# Patient Record
Sex: Female | Born: 1990 | Race: White | Hispanic: No | Marital: Single | Smoking: Never smoker
Health system: Southern US, Community
[De-identification: ages and names within clinical notes are randomized; demographics above are authoritative.]

## PROBLEM LIST (undated history)

## (undated) DIAGNOSIS — E119 Type 2 diabetes mellitus without complications: Secondary | ICD-10-CM

## (undated) DIAGNOSIS — E282 Polycystic ovarian syndrome: Secondary | ICD-10-CM

---
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
## 2020-09-01 ENCOUNTER — Other Ambulatory Visit: Payer: Self-pay

## 2020-09-01 ENCOUNTER — Emergency Department
Admission: EM | Admit: 2020-09-01 | Discharge: 2020-09-01 | Disposition: A | Discharge status: Home / Self Care | Payer: Self-pay | Source: Home / Self Care

## 2020-09-01 DIAGNOSIS — N3001 Acute cystitis with hematuria: Secondary | ICD-10-CM

## 2020-09-01 DIAGNOSIS — R300 Dysuria: Secondary | ICD-10-CM

## 2020-09-01 HISTORY — DX: Type 2 diabetes mellitus without complications: E11.9

## 2020-09-01 LAB — POCT URINALYSIS DIP (MANUAL ENTRY)
Bilirubin, UA: NEGATIVE
Glucose, UA: NEGATIVE mg/dL
Ketones, POC UA: NEGATIVE mg/dL
Leukocytes, UA: NEGATIVE
Nitrite, UA: NEGATIVE
Protein Ur, POC: NEGATIVE mg/dL
Spec Grav, UA: 1.02 (ref 1.010–1.025)
Urobilinogen, UA: 0.2 E.U./dL
pH, UA: 7 (ref 5.0–8.0)

## 2020-09-01 NOTE — ED Triage Notes (Signed)
Patient presents to Urgent Care with complaints of burning with urination and frequency since last week. Patient reports she did a teledoc visit and was prescribed macrobid, states the urgency got better but the burning never went away and now she is finished w/ the antibiotic and still has the burning and now some blood with her urine.

## 2020-09-01 NOTE — Discharge Instructions (Signed)
°  You will be notified in 2-4 days of the results of your urine culture if an antibiotic is indicated. If no bacteria grows or it shows that macrobid should have helped, you will need further evaluation and treatment of your symptoms by a urologist.  If a different antibiotic is indicated, you will be notified and the medication can be called into your preferred pharmacy. In the meantime, continue to stay well hydrated to help keep your urine clear to light yellow in color.

## 2020-09-01 NOTE — ED Provider Notes (Signed)
[facility]    CSN: [id number] Arrival date & time: 09/01/20  1138      History   Chief Complaint Chief Complaint  Patient presents with  . Dysuria    HPI Susan Jarvis is a 29 y.o. female.   HPI  Susan Jarvis is a 29 y.o. female presenting to UC with c/o continued burning with urination and hematuria despite completing a course of macrobid yesterday. Pt did a Teledoc visit on Sunday for urinary frequency and burning. The frequency has resolved but she continues to have burning.  Hx of PCOS, believes her last period was in October. Knows the blood is from her urine rather than vaginal bleeding.  Denies fever, chills, n/v/d. No abdominal pain or back pain. No hx of kidney stones or family hx of kidney stones.    Past Medical History:  Diagnosis Date  . Diabetes mellitus without complication (HCC)     There are no problems to display for this patient.   History reviewed. No pertinent surgical history.  OB History   No obstetric history on file.      Home Medications    Prior to Admission medications   Medication Sig Start Date End Date Taking? Authorizing Provider  amphetamine-dextroamphetamine (ADDERALL) 30 MG tablet Take 40 mg by mouth daily.    [provider]  metFORMIN (GLUMETZA) 500 MG (MOD) 24 hr tablet Take 500 mg by mouth daily with breakfast.    [provider]  traZODone (DESYREL) 100 MG tablet Take 200 mg by mouth at bedtime.    [provider]    Family History Family History  Problem Relation Age of Onset  . Healthy Mother   . Healthy Father     Social History Social History   Tobacco Use  . Smoking status: Never Smoker  . Smokeless tobacco: Never Used  Substance Use Topics  . Alcohol use: Not Currently     Allergies   Patient has no known allergies.   Review of Systems Review of Systems  Constitutional: Negative for chills and fever.  Gastrointestinal: Negative for abdominal pain,  diarrhea, nausea and vomiting.  Genitourinary: Positive for dysuria, frequency and hematuria. Negative for pelvic pain, vaginal bleeding, vaginal discharge and vaginal pain.  Musculoskeletal: Negative for back pain.  Neurological: Negative for dizziness and headaches.     Physical Exam Triage Vital Signs ED Triage Vitals  Enc Vitals Group     BP 09/01/20 1155 (!) 145/100     Pulse Rate 09/01/20 1155 (!) 101     Resp 09/01/20 1155 16     Temp 09/01/20 1155 98.9 F (37.2 C)     Temp Source 09/01/20 1155 Oral     SpO2 09/01/20 1155 98 %     Weight --      Height --      Head Circumference --      Peak Flow --      Pain Score 09/01/20 1152 0     Pain Loc --      Pain Edu? --      Excl. in GC? --    No data found.  Updated Vital Signs BP (S) (!) 145/100 (BP Location: Right Wrist)   Pulse (!) 101   Temp 98.9 F (37.2 C) (Oral)   Resp 16   SpO2 98%   Visual Acuity Right Eye Distance:   Left Eye Distance:   Bilateral Distance:    Right Eye Near:   Left Eye  Near:    Bilateral Near:     Physical Exam Vitals and nursing note reviewed.  Constitutional:      Appearance: Normal appearance. She is well-developed and well-nourished.  HENT:     Head: Normocephalic and atraumatic.  Eyes:     Extraocular Movements: EOM normal.  Cardiovascular:     Rate and Rhythm: Normal rate and regular rhythm.  Pulmonary:     Effort: Pulmonary effort is normal. No respiratory distress.     Breath sounds: Normal breath sounds.  Abdominal:     General: There is no distension.     Palpations: Abdomen is soft.     Tenderness: There is no abdominal tenderness. There is no right CVA tenderness or left CVA tenderness.  Musculoskeletal:        General: Normal range of motion.     Cervical back: Normal range of motion.  Skin:    General: Skin is warm and dry.  Neurological:     Mental Status: She is alert and oriented to person, place, and time.  Psychiatric:        Mood and Affect: Mood  and affect normal.        Behavior: Behavior normal.      UC Treatments / Results  Labs (all labs ordered are listed, but only abnormal results are displayed) Labs Reviewed  POCT URINALYSIS DIP (MANUAL ENTRY) - Abnormal; Notable for the following components:      Result Value   Clarity, UA cloudy (*)    Blood, UA large (*)    All other components within normal limits  URINE CULTURE    EKG   Radiology No results found.  Procedures Procedures (including critical care time)  Medications Ordered in UC Medications - No data to display  Initial Impression / Assessment and Plan / UC Course  I have reviewed the triage vital signs and the nursing notes.  Pertinent labs & imaging results that were available during my care of the patient were reviewed by me and considered in my medical decision making (see chart for details).     UA c/w hematuria  No evidence of UTI at this time, Due to recent antibiotic use, culture sent Encouraged good hydration, f/u with urology if symptoms persist, especially if urine culture is normal.  Final Clinical Impressions(s) / UC Diagnoses   Final diagnoses:  Dysuria  Hematuria due to acute cystitis     Discharge Instructions      You will be notified in 2-4 days of the results of your urine culture if an antibiotic is indicated. If no bacteria grows or it shows that macrobid should have helped, you will need further evaluation and treatment of your symptoms by a urologist.  If a different antibiotic is indicated, you will be notified and the medication can be called into your preferred pharmacy. In the meantime, continue to stay well hydrated to help keep your urine clear to light yellow in color.     ED Prescriptions    None     PDMP not reviewed this encounter.   Lurene Shadow, PA-C 09/01/20 1229

## 2020-09-02 LAB — URINE CULTURE
MICRO NUMBER:: 11331863
SPECIMEN QUALITY:: ADEQUATE

## 2020-10-25 ENCOUNTER — Other Ambulatory Visit: Payer: Self-pay

## 2020-10-25 ENCOUNTER — Encounter (HOSPITAL_BASED_OUTPATIENT_CLINIC_OR_DEPARTMENT_OTHER): Payer: Self-pay

## 2020-10-25 ENCOUNTER — Emergency Department (HOSPITAL_BASED_OUTPATIENT_CLINIC_OR_DEPARTMENT_OTHER)
Admission: EM | Admit: 2020-10-25 | Discharge: 2020-10-25 | Disposition: A | Discharge status: Home / Self Care | Payer: BC Managed Care – PPO | Attending: Emergency Medicine | Admitting: Emergency Medicine

## 2020-10-25 DIAGNOSIS — Z7984 Long term (current) use of oral hypoglycemic drugs: Secondary | ICD-10-CM | POA: Insufficient documentation

## 2020-10-25 DIAGNOSIS — M545 Low back pain, unspecified: Secondary | ICD-10-CM | POA: Diagnosis not present

## 2020-10-25 DIAGNOSIS — R109 Unspecified abdominal pain: Secondary | ICD-10-CM | POA: Diagnosis present

## 2020-10-25 DIAGNOSIS — E119 Type 2 diabetes mellitus without complications: Secondary | ICD-10-CM | POA: Insufficient documentation

## 2020-10-25 DIAGNOSIS — R8271 Bacteriuria: Secondary | ICD-10-CM

## 2020-10-25 HISTORY — DX: Polycystic ovarian syndrome: E28.2

## 2020-10-25 LAB — URINALYSIS, MICROSCOPIC (REFLEX)

## 2020-10-25 LAB — PREGNANCY, URINE: Preg Test, Ur: NEGATIVE

## 2020-10-25 LAB — URINALYSIS, ROUTINE W REFLEX MICROSCOPIC
Bilirubin Urine: NEGATIVE
Glucose, UA: NEGATIVE mg/dL
Ketones, ur: NEGATIVE mg/dL
Nitrite: NEGATIVE
Protein, ur: NEGATIVE mg/dL
Specific Gravity, Urine: 1.03 (ref 1.005–1.030)
pH: 6 (ref 5.0–8.0)

## 2020-10-25 MED ORDER — CEPHALEXIN 500 MG PO CAPS: 500.0000 mg | ORAL_CAPSULE | Freq: Four times a day (QID) | ORAL | 0 refills | Status: AC

## 2020-10-25 NOTE — ED Triage Notes (Addendum)
Pt c/o bilat flank pain, abd pain-pt states she was dx with UTI in Dec 2021-states she did complete abx-states she has cont'd sx-states she is taking abx from a televisit-was advised by PCP to come to ED-NAD-steady gait

## 2020-10-25 NOTE — Discharge Instructions (Signed)
Please stop taking the Bactrim.  Instead please start taking Keflex.  If you develop fevers, worsening symptoms or have other concerns please seek additional medical care and evaluation.  If this does not resolve your issue I would recommend that you schedule a follow-up with urology.  You may have diarrhea from the antibiotics.  It is very important that you continue to take the antibiotics even if you get diarrhea unless a medical professional tells you that you may stop taking them.  If you stop too early the bacteria you are being treated for will become stronger and you may need different, more powerful antibiotics that have more side effects and worsening diarrhea.  Please stay well hydrated and consider probiotics as they may decrease the severity of your diarrhea.  Please be aware that if you take any hormonal contraception (birth control pills, nexplanon, the ring, etc) that your birth control will not work while you are taking antibiotics and you need to use back up protection as directed on the birth control medication information insert.

## 2020-10-25 NOTE — ED Provider Notes (Signed)
[facility] EMERGENCY DEPARTMENT Provider Note   CSN: [id number] Arrival date & time: 10/25/20  1729     History Chief Complaint  Patient presents with  . Flank Pain    Susan Jarvis is a 30 y.o. female with a past medical history of PCOS, GAD, MDD, ADHD, who presents today for evaluation of urinary frequency, burning and urgency.  She reports that this started originally back in December and that her symptoms have been intermittent since then.  She has completed Macrobid, 1 week of Bactrim and is currently a few days into her second course of bactrum lasting two weeks.  She states that she was told if her symptoms did not start to improve after 2 to 3 days to come to the emergency room.  She reports that she has been having some pain in her lower back and her sides however that is not the primary reason why she is here today.  She reports that her urinary frequency has been getting better however she is still having hematuria and dysuria.  She denies waxing and waning flank pain.  She is not concerned for STI, denies any abnormal vaginal discharge or bleeding.  She does not have a history of kidney stones.  She denies specific pelvic pain.   She was taking Azo for her symptoms however stopped that about 2 days ago.   No nausea, vomiting or diarrhea however she does note slightly decreased appetite.  No fevers however she does report hot flashes.  She had a vaginal swab on 09/05/2020 that did not show evidence of infection or other abnormalities.    HPI     Past Medical History:  Diagnosis Date  . Diabetes mellitus without complication (HCC)   . PCOS (polycystic ovarian syndrome)     There are no problems to display for this patient.   History reviewed. No pertinent surgical history.   OB History   No obstetric history on file.     Family History  Problem Relation Age of Onset  . Healthy Mother   . Healthy Father     Social History   Tobacco Use  .  Smoking status: Never Smoker  . Smokeless tobacco: Never Used  Vaping Use  . Vaping Use: Never used  Substance Use Topics  . Alcohol use: Not Currently  . Drug use: Never    Home Medications Prior to Admission medications   Medication Sig Start Date End Date Taking? Authorizing Provider  cephALEXin (KEFLEX) 500 MG capsule Take 1 capsule (500 mg total) by mouth 4 (four) times daily for 10 days. 10/25/20 11/04/20 Yes Cristina Gong, PA-C  amphetamine-dextroamphetamine (ADDERALL) 30 MG tablet Take 40 mg by mouth daily.    [provider]  metFORMIN (GLUMETZA) 500 MG (MOD) 24 hr tablet Take 500 mg by mouth daily with breakfast.    [provider]  traZODone (DESYREL) 100 MG tablet Take 200 mg by mouth at bedtime.    [provider]    Allergies    Patient has no known allergies.  Review of Systems   Review of Systems  Constitutional: Negative for chills, fatigue and fever.  HENT: Negative for congestion.   Respiratory: Negative for shortness of breath.   Gastrointestinal: Positive for abdominal pain. Negative for constipation, diarrhea and vomiting.  Genitourinary: Positive for dysuria, flank pain, frequency, hematuria and urgency. Negative for decreased urine volume, difficulty urinating, pelvic pain, vaginal bleeding and vaginal discharge.  Neurological: Negative for weakness and headaches.  Physical Exam Updated Vital Signs BP (!) 144/84 (BP Location: Right Arm)   Pulse (!) 118   Temp 98.3 F (36.8 C) (Oral)   Resp 20   Ht 5\' 6"  (1.676 m)   Wt (!) 151 kg   SpO2 96%   BMI 53.75 kg/m   Physical Exam Vitals and nursing note reviewed.  Constitutional:      General: She is not in acute distress.    Appearance: She is obese. She is not diaphoretic.  HENT:     Head: Normocephalic and atraumatic.  Eyes:     General: No scleral icterus.       Right eye: No discharge.        Left eye: No discharge.     Conjunctiva/sclera: Conjunctivae  normal.  Cardiovascular:     Rate and Rhythm: Normal rate and regular rhythm.  Pulmonary:     Effort: Pulmonary effort is normal. No respiratory distress.     Breath sounds: No stridor.  Abdominal:     General: There is no distension.     Tenderness: There is abdominal tenderness (Diffuse throughout entire abdomen, mild.). There is no right CVA tenderness, left CVA tenderness, guarding or rebound.  Musculoskeletal:        General: No deformity.     Cervical back: Normal range of motion.  Skin:    General: Skin is warm and dry.  Neurological:     Mental Status: She is alert.     Motor: No abnormal muscle tone.  Psychiatric:        Mood and Affect: Mood normal.        Behavior: Behavior normal.     ED Results / Procedures / Treatments   Labs (all labs ordered are listed, but only abnormal results are displayed) Labs Reviewed  URINALYSIS, ROUTINE W REFLEX MICROSCOPIC - Abnormal; Notable for the following components:      Result Value   Hgb urine dipstick SMALL (*)    Leukocytes,Ua TRACE (*)    All other components within normal limits  URINALYSIS, MICROSCOPIC (REFLEX) - Abnormal; Notable for the following components:   Bacteria, UA FEW (*)    All other components within normal limits  URINE CULTURE  PREGNANCY, URINE    EKG None  Radiology No results found.  Procedures Procedures   Medications Ordered in ED Medications - No data to display  ED Course  I have reviewed the triage vital signs and the nursing notes.  Pertinent labs & imaging results that were available during my care of the patient were reviewed by me and considered in my medical decision making (see chart for details).  Clinical Course as of 10/25/20 1942  Wed Oct 25, 2020  1928 Pulse Rate(!): 118 Patient reports that this is consistent with her baseline  [EH]    Clinical Course User Index [EH] Oct 27, 2020   MDM Rules/Calculators/A&P                         Patient is a  30 year old woman who presents today for evaluation of ongoing urinary tract symptoms including dysuria, hematuria, frequency, and urgency.  Knees have been intermittent for approximately 2 months now, and she has been treated with Macrobid, a week of Bactrim and is currently on her second course of Bactrim which is supposed to last 2 weeks.  She was told that if her symptoms did not improve in 2 to 3 days she should come  to the emergency room.  She reports that her frequency is improved, however she is still having other symptoms.  She denies any fevers.  She is at her baseline level of nausea.  No diarrhea or vomiting.  Here she is tachycardic with a heart rate of 118 however she tells me that that is her usual heart rate at baseline.  She is slightly hypertensive which again she says is usual for her.  However she is afebrile and not tachypneic.  She does have mild abdominal tenderness however she says it only hurts when abdomen is being palpated.  We did discuss evaluation of this including labs, and CT scan which she declined stating that she does not feel it is bad enough for that evaluation and her primary concern today is that all of her symptoms did not improve.    UA does show small blood with trace leukocytes.  Microscopy exam shows 0-5 reds, 0-5 whites, few bacteria and 6-10 squamous epithelial cells.  She is currently being treated with Bactrim however reports that in the past she has had a UTI cleared with Keflex.  We discussed switching antibiotic given that she continued to have symptoms after a course of bactrim, rather than repeat treating her with Bactrim we will switch to Keflex.  I reviewed her most recent culture in care everywhere that I could see which showed cephalosporin sensitive.  Urine culture is sent.  Pregnancy test is negative.  Recommended PCP follow-up in addition to urology.  Return precautions were discussed with patient who states their understanding.  At the time of  discharge patient denied any unaddressed complaints or concerns.  Patient is agreeable for discharge home.  Note: Portions of this report may have been transcribed using voice recognition software. Every effort was made to ensure accuracy; however, inadvertent computerized transcription errors may be present  Final Clinical Impression(s) / ED Diagnoses Final diagnoses:  Bacteriuria    Rx / DC Orders ED Discharge Orders         Ordered    cephALEXin (KEFLEX) 500 MG capsule  4 times daily        10/25/20 1927           Norman Clay 10/25/20 1943    Charlynne Pander, MD 10/25/20 2312

## 2020-10-27 LAB — URINE CULTURE
